# Patient Record
Sex: Male | Born: 1965 | Race: Black or African American | Hispanic: No | Smoking: Never smoker
Health system: Southern US, Community
[De-identification: ages and names within clinical notes are randomized; demographics above are authoritative.]

---

## 1998-06-10 ENCOUNTER — Ambulatory Visit (HOSPITAL_BASED_OUTPATIENT_CLINIC_OR_DEPARTMENT_OTHER): Admission: RE | Admit: 1998-06-10 | Discharge: 1998-06-10 | Payer: Self-pay | Admitting: Urology

## 1999-04-28 ENCOUNTER — Ambulatory Visit (HOSPITAL_BASED_OUTPATIENT_CLINIC_OR_DEPARTMENT_OTHER): Admission: RE | Admit: 1999-04-28 | Discharge: 1999-04-28 | Payer: Self-pay | Admitting: Urology

## 2006-11-22 ENCOUNTER — Ambulatory Visit: Payer: Self-pay | Admitting: Family Medicine

## 2008-09-10 ENCOUNTER — Ambulatory Visit: Payer: Self-pay | Admitting: Family Medicine

## 2008-12-15 ENCOUNTER — Ambulatory Visit: Payer: Self-pay | Admitting: Family Medicine

## 2010-06-09 NOTE — Op Note (Signed)
[place]  Patient:    Andre Stafford, Andre Stafford                    MRN: [id number] Proc. Date: 04/28/99 Adm. Date:  [id number] Attending:  Liborio Nixon                           Operative Report  PREOPERATIVE DIAGNOSIS:  Urethral stricture, recurrent.  POSTOPERATIVE DIAGNOSIS:  Urethral stricture, recurrent.  PROCEDURE:  Cystoscopy, visual internal urethrotomy.  SURGEON:  Bertram Millard. Dahlstedt, M.D.  ANESTHESIA:  General with LMA.  COMPLICATIONS:  None.  BRIEF HISTORY:  A 45 year old gentleman with recurrent urethral stricture.  He as had recent urinary tract infections, and has voiding symptoms.  He was found recently to have recurrent stricture, and is taken to the operating room at this time for internal urethrotomy.  Risks and complications have been discussed with the patient and are understood.  He desires to proceed.  DESCRIPTION OF PROCEDURE:  Preoperatively, the patient was administered gentamicin, taken to the operating room a general anesthetic using the LMA was performed. e was placed in the dorsal lithotomy position.  Genitalia and perineum were prepped and draped.  The visual urethrotome was passed under direct vision through the patients urethra up to the urethral stricture which was in the proximal pendulous urethra.  The urethra was carefully incised at the 6 oclock position throughout  its length.  There were several narrowed areas within a 2 cm span.  All of these were easily incised.  The bulbous urethra was then inspected and found to be normal.  The prostatic urethra was normal, bladder was normal.  At this point, the strictures were again visualized.  They were again incised slightly such that they admitted a 25-French scope quite easily.  At this point, the bladder was drained, the scope removed, and the procedure terminated.  The patient was awakened and taken to PACU in stable condition.  He will  follow up in approximately two weeks.  He was discharged on Vicodin one p.o. q.6h. p.r.n.  pain, #15 and Pyridium 200 mg one p.o. q.6h. p.r.n. burning. DD:  04/28/99 TD:  04/28/99 Job: [id number]

## 2020-05-09 ENCOUNTER — Encounter: Payer: Self-pay | Admitting: Family Medicine

## 2020-05-09 ENCOUNTER — Other Ambulatory Visit: Payer: Self-pay

## 2020-05-09 ENCOUNTER — Ambulatory Visit (INDEPENDENT_AMBULATORY_CARE_PROVIDER_SITE_OTHER): Payer: 59 | Admitting: Family Medicine

## 2020-05-09 VITALS — BP 122/86 | HR 63 | Temp 97.3°F | Ht 73.0 in | Wt 172.8 lb

## 2020-05-09 DIAGNOSIS — Z1159 Encounter for screening for other viral diseases: Secondary | ICD-10-CM

## 2020-05-09 DIAGNOSIS — Z0000 Encounter for general adult medical examination without abnormal findings: Secondary | ICD-10-CM

## 2020-05-09 DIAGNOSIS — Z8042 Family history of malignant neoplasm of prostate: Secondary | ICD-10-CM

## 2020-05-09 DIAGNOSIS — Z23 Encounter for immunization: Secondary | ICD-10-CM | POA: Diagnosis not present

## 2020-05-09 DIAGNOSIS — Z1211 Encounter for screening for malignant neoplasm of colon: Secondary | ICD-10-CM

## 2020-05-09 DIAGNOSIS — F640 Transsexualism: Secondary | ICD-10-CM

## 2020-05-09 NOTE — Progress Notes (Signed)
   Subjective:    Patient ID: Andre Stafford, male    DOB: [birth year], 55 y.o.   MRN: [id number]  HPI He is here after not being seen here in over a decade.  He would like a complete examination done.  He mentions that he has questions about his gender identity.  He states that he has had these thoughts his entire life.  He is now starting to address this issue.  He has made some changes to his facial hair by trying to eliminate that, changing his hair style, using fingernail polish.  He apparently has discussed this with his wife and daughter.  He would like to pursue this further.  Otherwise he has no particular concerns or complaints.  He does have a family history of prostate cancer.  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  Family and social history as well as health maintenance and immunizations was reviewed.   Review of Systems  All other systems reviewed and are negative.      Objective:   Physical Exam Alert and in no distress. tympanic membranes and canals are normal. Pharyngeal area is normal. Neck is supple without adenopathy or thyromegaly. Cardiac exam shows a regular sinus rhythm without murmurs or gallops. Lungs are clear to auscultation.  Abdominal exam shows no masses or tenderness.        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  He has done some research.  Gave him the [facility] website to go to.  Also gave him a business card for [employer] to get some counseling concerning this issue.  Explained that we can keep in touch  in all this but I was not planning on making him a return call but we will definitely see him on a yearly basis or sooner if need be.  He was comfortable with that.

## 2020-05-10 LAB — CBC WITH DIFFERENTIAL/PLATELET
Basophils Absolute: 0.1 10*3/uL (ref 0.0–0.2)
Basos: 1 %
EOS (ABSOLUTE): 0.3 10*3/uL (ref 0.0–0.4)
Eos: 6 %
Hematocrit: 41.6 % (ref 37.5–51.0)
Hemoglobin: 13.9 g/dL (ref 13.0–17.7)
Immature Grans (Abs): 0 10*3/uL (ref 0.0–0.1)
Immature Granulocytes: 0 %
Lymphocytes Absolute: 1.2 10*3/uL (ref 0.7–3.1)
Lymphs: 23 %
MCH: 29.6 pg (ref 26.6–33.0)
MCHC: 33.4 g/dL (ref 31.5–35.7)
MCV: 89 fL (ref 79–97)
Monocytes Absolute: 0.6 10*3/uL (ref 0.1–0.9)
Monocytes: 11 %
Neutrophils Absolute: 3.1 10*3/uL (ref 1.4–7.0)
Neutrophils: 59 %
Platelets: 207 10*3/uL (ref 150–450)
RBC: 4.69 x10E6/uL (ref 4.14–5.80)
RDW: 12.4 % (ref 11.6–15.4)
WBC: 5.2 10*3/uL (ref 3.4–10.8)

## 2020-05-10 LAB — LIPID PANEL
Chol/HDL Ratio: 2.9 ratio (ref 0.0–5.0)
Cholesterol, Total: 198 mg/dL (ref 100–199)
HDL: 68 mg/dL (ref >39)
LDL Chol Calc (NIH): 122 mg/dL — ABNORMAL HIGH (ref 0–99)
Triglycerides: 44 mg/dL (ref 0–149)
VLDL Cholesterol Cal: 8 mg/dL (ref 5–40)

## 2020-05-10 LAB — COMPREHENSIVE METABOLIC PANEL
ALT: 19 IU/L (ref 0–44)
AST: 22 IU/L (ref 0–40)
Albumin/Globulin Ratio: 1.9 (ref 1.2–2.2)
Albumin: 4.5 g/dL (ref 3.8–4.9)
Alkaline Phosphatase: 86 IU/L (ref 44–121)
BUN/Creatinine Ratio: 14 (ref 9–20)
BUN: 17 mg/dL (ref 6–24)
Bilirubin Total: 0.7 mg/dL (ref 0.0–1.2)
CO2: 22 mmol/L (ref 20–29)
Calcium: 9.8 mg/dL (ref 8.7–10.2)
Chloride: 103 mmol/L (ref 96–106)
Creatinine, Ser: 1.18 mg/dL (ref 0.76–1.27)
Globulin, Total: 2.4 g/dL (ref 1.5–4.5)
Glucose: 89 mg/dL (ref 65–99)
Potassium: 4.8 mmol/L (ref 3.5–5.2)
Sodium: 140 mmol/L (ref 134–144)
Total Protein: 6.9 g/dL (ref 6.0–8.5)
eGFR: 73 mL/min/{1.73_m2} (ref >59)

## 2020-05-10 LAB — SPECIMEN STATUS REPORT

## 2020-05-10 LAB — HEPATITIS C ANTIBODY: Hep C Virus Ab: <0.1 s/co ratio (ref 0.0–0.9)

## 2020-05-10 LAB — PSA: Prostate Specific Ag, Serum: 0.7 ng/mL (ref 0.0–4.0)

## 2020-05-16 LAB — COLOGUARD: Cologuard: NEGATIVE

## 2020-05-18 LAB — COLOGUARD

## 2020-05-21 LAB — COLOGUARD: COLOGUARD: NEGATIVE

## 2020-05-24 ENCOUNTER — Encounter: Payer: Self-pay | Admitting: Family Medicine

## 2020-05-24 ENCOUNTER — Other Ambulatory Visit: Payer: Self-pay

## 2020-05-24 ENCOUNTER — Ambulatory Visit (INDEPENDENT_AMBULATORY_CARE_PROVIDER_SITE_OTHER): Payer: 59 | Admitting: Family Medicine

## 2020-05-24 ENCOUNTER — Ambulatory Visit
Admission: RE | Admit: 2020-05-24 | Discharge: 2020-05-24 | Disposition: A | Discharge status: Home / Self Care | Payer: 59 | Source: Ambulatory Visit | Attending: Family Medicine | Admitting: Family Medicine

## 2020-05-24 VITALS — BP 128/86 | HR 88 | Temp 99.6°F | Ht 73.5 in | Wt 178.8 lb

## 2020-05-24 DIAGNOSIS — M79675 Pain in left toe(s): Secondary | ICD-10-CM | POA: Diagnosis not present

## 2020-05-24 NOTE — Progress Notes (Signed)
   Subjective:    Patient ID: Andre Stafford, male    DOB: 1965-09-30, 55 y.o.   MRN: [id number]  HPI The patient is here for evaluation and treatment of left great toe pain.  The original injury occurred in the 90s when she was doing martial arts.  Since then she has had intermittent pain in area with increased physical activity.  Usually the pain occurs after the physical activity is over.   Review of Systems     Objective:   Physical Exam Left foot evaluation shows normal motion with no palpable tenderness to the ankle, MTP or DIP joints.  Normal motion.       Assessment & Plan:  Great toe pain, left - Plan: DG Toe Great Left I explained that probably this is an arthritic change from a previous injury and then reinjuring it with increased physical activity.  Discussed patient's in regard to care.  Recommended shoes with good arch supports to help take some of the pressure off of that joint.  Mention the possibility of podiatry or orthopedics for more definitive care.  Recommend Tylenol and NSAID of choice for pain relief.

## 2020-05-24 NOTE — Progress Notes (Signed)
Pt was advised at appointment today 05/24/20. KH

## 2020-05-25 ENCOUNTER — Encounter: Payer: Self-pay | Admitting: Family Medicine

## 2020-07-19 ENCOUNTER — Other Ambulatory Visit: Payer: Self-pay

## 2020-07-19 ENCOUNTER — Ambulatory Visit (INDEPENDENT_AMBULATORY_CARE_PROVIDER_SITE_OTHER): Payer: 59 | Admitting: Family Medicine

## 2020-07-19 VITALS — BP 102/68 | HR 65 | Temp 97.9°F | Wt 169.4 lb

## 2020-07-19 DIAGNOSIS — N410 Acute prostatitis: Secondary | ICD-10-CM

## 2020-07-19 DIAGNOSIS — Z23 Encounter for immunization: Secondary | ICD-10-CM

## 2020-07-19 DIAGNOSIS — R350 Frequency of micturition: Secondary | ICD-10-CM

## 2020-07-19 LAB — POCT URINALYSIS DIP (PROADVANTAGE DEVICE)
Glucose, UA: NEGATIVE mg/dL
Ketones, POC UA: NEGATIVE mg/dL
Nitrite, UA: NEGATIVE
Specific Gravity, Urine: 1.025
Urobilinogen, Ur: 0.2
pH, UA: 6 (ref 5.0–8.0)

## 2020-07-19 MED ORDER — SULFAMETHOXAZOLE-TRIMETHOPRIM 800-160 MG PO TABS
1.0000 | ORAL_TABLET | Freq: Two times a day (BID) | ORAL | 0 refills | Status: DC
Start: 2020-07-19 — End: 2020-11-10

## 2020-07-19 NOTE — Progress Notes (Signed)
   Subjective:    Patient ID: Andre Stafford, male    DOB: 1965/12/29, 55 y.o.   MRN: [id number]  HPI He states that 4 days ago he had an the onset of chills, myalgias, urinary hesitancy, decreased stream and seeing pinkish urine.  No lower abdominal or rectal discomfort.  No sexual activity in the last several months.   Review of Systems     Objective:   Physical Exam Alert and in no distress.  Genital exam shows normal penis and testes.  No lower abdominal discomfort.  Rectal exam shows a normal-sized prostate that is nontender. Urine microscopic exam did show red cells and white cells.       Assessment & Plan:  Acute prostatitis  Frequency of urination - Plan: POCT Urinalysis DIP (Proadvantage Device)  Need for viral immunization - Plan: SARSCOV2 MODERNA BOOSTER VACCINE, 50 MCG/0.25 ML, IM Return here in about 3 weeks for recheck on the urine.  Sooner if there is any questions.

## 2020-07-19 NOTE — Addendum Note (Signed)
Addended by: Renelda Loma on: 07/19/2020 01:21 PM   Modules accepted: Orders

## 2020-07-27 LAB — URINE CULTURE

## 2020-08-09 ENCOUNTER — Encounter: Payer: Self-pay | Admitting: Family Medicine

## 2020-08-09 ENCOUNTER — Other Ambulatory Visit: Payer: Self-pay

## 2020-08-09 ENCOUNTER — Ambulatory Visit (INDEPENDENT_AMBULATORY_CARE_PROVIDER_SITE_OTHER): Payer: 59 | Admitting: Family Medicine

## 2020-08-09 VITALS — BP 112/70 | HR 52 | Temp 96.6°F | Wt 170.6 lb

## 2020-08-09 DIAGNOSIS — N410 Acute prostatitis: Secondary | ICD-10-CM | POA: Diagnosis not present

## 2020-08-09 LAB — POCT URINALYSIS DIP (PROADVANTAGE DEVICE)
Bilirubin, UA: NEGATIVE
Blood, UA: NEGATIVE
Glucose, UA: NEGATIVE mg/dL
Ketones, POC UA: NEGATIVE mg/dL
Leukocytes, UA: NEGATIVE
Nitrite, UA: NEGATIVE
Protein Ur, POC: NEGATIVE mg/dL
Specific Gravity, Urine: 1.03
Urobilinogen, Ur: 0.2
pH, UA: 6 (ref 5.0–8.0)

## 2020-08-09 NOTE — Addendum Note (Signed)
Addended by: Renelda Loma on: 08/09/2020 11:04 AM   Modules accepted: Orders

## 2020-08-09 NOTE — Progress Notes (Signed)
   Subjective:    Patient ID: Andre Stafford, male    DOB: [birth year], 55 y.o.   MRN: [id number]  HPI She is here for recheck.  She states that she is now totally back to normal and having no urinary type symptoms.  Previous culture was reviewed and the appropriate antibiotic was given.   Review of Systems     Objective:   Physical Exam Alert and in no distress.  Urine dipstick was negative.       Assessment & Plan:  Acute prostatitis He is now asymptomatic so no further intervention needed.

## 2020-11-10 ENCOUNTER — Encounter: Payer: Self-pay | Admitting: Family Medicine

## 2020-11-10 ENCOUNTER — Ambulatory Visit (INDEPENDENT_AMBULATORY_CARE_PROVIDER_SITE_OTHER): Payer: 59 | Admitting: Family Medicine

## 2020-11-10 ENCOUNTER — Other Ambulatory Visit: Payer: Self-pay

## 2020-11-10 VITALS — BP 120/78 | HR 72 | Temp 97.1°F | Ht 74.0 in | Wt 173.6 lb

## 2020-11-10 DIAGNOSIS — L282 Other prurigo: Secondary | ICD-10-CM

## 2020-11-10 NOTE — Patient Instructions (Signed)
  I recommend claritin or allegra, once daily, to help with any itching. You can use an over-the-counter 1% hydrocortisone cream to any areas of itching as well (more the toe area, and the hand). Keep your skin well moisturized and drink plenty of water.  I would use bacitracin rather neosporin, and this only needs to be applied to the scabbed area on the toe (and if the blister opens).  Return if change in rash, if there is any redness, swelling, pain, fever, or other concerns.

## 2020-11-10 NOTE — Progress Notes (Signed)
Chief Complaint  Patient presents with   Rash    Rash x 2 days both feet, arms and hands. Very itchy, no pain. Blister-like.     Started with a white spot on the left 4th toe (originally looked like a white spot, now looks black/scabbed).  He put neosporin and a bandage. It was itchy.  Also noted a spot on the L hand, which popped and is now healing. Yesterday he saw the blisters on the left foot, near the area that was a white spot.  All very itchy. He reports using a round bandage and neosporin, and some coconut oil, none of which was a new product.  Yesterday also noticed fine bumps and itching of the right forearm. He has been using Benzocaine for itching, not using other topical medications.  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  No known skin exposure.  Denies STD exposure/risk  PMH, PSH, SH reviewed  Outpatient Encounter Medications as of 11/10/2020  Medication Sig   APPLE CIDER VINEGAR PO Take by mouth.   Ascorbic Acid (VITAMIN C PO) Take by mouth.   ASHWAGANDHA PO Take by mouth.   Nutritional Supplements (EAA SUPPLEMENT PO) Take by mouth.   [DISCONTINUED] sulfamethoxazole-trimethoprim (BACTRIM DS) 800-160 MG tablet Take 1 tablet by mouth 2 (two) times daily. (Patient not taking: Reported on 08/09/2020)   No facility-administered encounter medications on file as of 11/10/2020.   No Known Allergies  ROS: no fever, chills, URI symptoms, headaches, GI complaints.  Denies any STD symptoms or exposures. Itchy skin and rash as per HPI.   PHYSICAL EXAM:  BP 120/78   Pulse 72   Temp (!) 97.1 F (36.2 C) (Tympanic)   Ht 6\' 2"  (1.88 m)   Wt 173 lb 9.6 oz (78.7 kg)   BMI 22.29 kg/m   Well appearing patient in no distress, in good spirits HEENT: conjunctiva and sclera are clear, EOMI, wearing mask Skin: L foot: Cluster of blisters at the top of the foot at the distal 4th metatarsal.  Doesn't extend onto toe, but that is where there is a small scab,  at the base of the toe. No erythema, soft tissue swelling.  Blisters are small, the larger one is a little tense, slightly tender. L hand--pt reports looked similar to the foot, but "popped"--slightly raised and hyperpigmented. R forearm--slightly dry, no rash noted, didn't feel different than the left arm (which wasn't itchy) Psych: normal mood, affect, hygiene and grooming   ASSESSMENT/PLAN:   Pruritic rash - unclear etiology; blisters on foot may be contact derm.  Antihistamine, moisturize, antibact ointment to scabs/open blisters.  f/u if worsens   I recommend claritin or allegra, once daily, to help with any itching. You can use an over-the-counter 1% hydrocortisone cream to any areas of itching as well (more the toe area, and the hand). Keep your skin well moisturized and drink plenty of water.  I would use bacitracin rather neosporin, and this only needs to be applied to the scabbed area on the toe (and if the blister opens).  Return if change in rash, if there is any redness, swelling, pain, fever, or other concerns.

## 2020-11-22 ENCOUNTER — Other Ambulatory Visit: Payer: Self-pay

## 2020-11-22 ENCOUNTER — Ambulatory Visit (INDEPENDENT_AMBULATORY_CARE_PROVIDER_SITE_OTHER): Payer: 59

## 2020-11-22 DIAGNOSIS — Z23 Encounter for immunization: Secondary | ICD-10-CM

## 2020-12-06 ENCOUNTER — Other Ambulatory Visit: Payer: Self-pay

## 2020-12-06 ENCOUNTER — Other Ambulatory Visit (INDEPENDENT_AMBULATORY_CARE_PROVIDER_SITE_OTHER): Payer: 59

## 2020-12-06 DIAGNOSIS — Z23 Encounter for immunization: Secondary | ICD-10-CM | POA: Diagnosis not present

## 2021-02-07 ENCOUNTER — Other Ambulatory Visit (INDEPENDENT_AMBULATORY_CARE_PROVIDER_SITE_OTHER): Payer: 59

## 2021-02-07 ENCOUNTER — Other Ambulatory Visit: Payer: Self-pay

## 2021-02-07 DIAGNOSIS — Z23 Encounter for immunization: Secondary | ICD-10-CM | POA: Diagnosis not present

## 2021-05-23 ENCOUNTER — Ambulatory Visit (INDEPENDENT_AMBULATORY_CARE_PROVIDER_SITE_OTHER): Payer: 59 | Admitting: Family Medicine

## 2021-05-23 ENCOUNTER — Encounter: Payer: Self-pay | Admitting: Family Medicine

## 2021-05-23 VITALS — BP 120/82 | HR 62 | Temp 96.8°F | Ht 73.0 in | Wt 184.0 lb

## 2021-05-23 DIAGNOSIS — Z0000 Encounter for general adult medical examination without abnormal findings: Secondary | ICD-10-CM

## 2021-05-23 DIAGNOSIS — Z8042 Family history of malignant neoplasm of prostate: Secondary | ICD-10-CM | POA: Diagnosis not present

## 2021-05-23 DIAGNOSIS — Z789 Other specified health status: Secondary | ICD-10-CM | POA: Diagnosis not present

## 2021-05-23 DIAGNOSIS — Z125 Encounter for screening for malignant neoplasm of prostate: Secondary | ICD-10-CM | POA: Diagnosis not present

## 2021-05-23 NOTE — Progress Notes (Signed)
? ?  Subjective:  ? ? Patient ID: Andre Stafford, adult    DOB: 1965/03/02, 56 y.o.   MRN: [id number] ? ?HPI ?The patient is here for complete exam.  She is in the process of transitioning from M to F.  She is involved in counseling and has been to 2 sessions.  She is doing this through the GGF.  Been living and dressing in the gender that she desires for the last 3 years.  She is in the process of getting a divorce and is now presently separated.  She does not smoke or drink.  She would eventually like to start on hormone replacement.  She does have a family history of prostate cancer. ? ? ?Review of Systems  ?All other systems reviewed and are negative. ? ?   ?Objective:  ? Physical Exam ?Alert and in no distress. Tympanic membranes and canals are normal. Pharyngeal area is normal. Neck is supple without adenopathy or thyromegaly. Cardiac exam shows a regular sinus rhythm without murmurs or gallops. Lungs are clear to auscultation. ? ? ? ? ?   ?Assessment & Plan:  ?Routine general medical examination at a health care facility - Plan: CBC with Differential/Platelet, Comprehensive metabolic panel, Lipid panel ? ?Family history of prostate cancer in father - Plan: PSA ? ?Male-to-male transgender person ? ?Screening for prostate cancer - Plan: PSA ?Discussed the continuing transition.  She will continue in counseling and I will ask her to have the therapist send me a note concerning this issue and the possibility of any underlying other psychological problems.  I anticipate no problems in she will eventually be placed on estrogen as well as spironolactone probably within the next year. ? ?

## 2021-05-24 LAB — COMPREHENSIVE METABOLIC PANEL
ALT: 20 IU/L (ref 0–44)
AST: 16 IU/L (ref 0–40)
Albumin/Globulin Ratio: 1.8 (ref 1.2–2.2)
Albumin: 4.6 g/dL (ref 3.8–4.9)
Alkaline Phosphatase: 89 IU/L (ref 44–121)
BUN/Creatinine Ratio: 11 (ref 9–20)
BUN: 13 mg/dL (ref 6–24)
Bilirubin Total: 0.6 mg/dL (ref 0.0–1.2)
CO2: 25 mmol/L (ref 20–29)
Calcium: 9.8 mg/dL (ref 8.7–10.2)
Chloride: 105 mmol/L (ref 96–106)
Creatinine, Ser: 1.15 mg/dL (ref 0.76–1.27)
Globulin, Total: 2.5 g/dL (ref 1.5–4.5)
Glucose: 94 mg/dL (ref 70–99)
Potassium: 4.8 mmol/L (ref 3.5–5.2)
Sodium: 142 mmol/L (ref 134–144)
Total Protein: 7.1 g/dL (ref 6.0–8.5)
eGFR: 75 mL/min/{1.73_m2} (ref >59)

## 2021-05-24 LAB — CBC WITH DIFFERENTIAL/PLATELET
Basophils Absolute: 0 10*3/uL (ref 0.0–0.2)
Basos: 1 %
EOS (ABSOLUTE): 0.2 10*3/uL (ref 0.0–0.4)
Eos: 5 %
Hematocrit: 42.3 % (ref 37.5–51.0)
Hemoglobin: 14.4 g/dL (ref 13.0–17.7)
Immature Grans (Abs): 0 10*3/uL (ref 0.0–0.1)
Immature Granulocytes: 0 %
Lymphocytes Absolute: 1.3 10*3/uL (ref 0.7–3.1)
Lymphs: 30 %
MCH: 29.7 pg (ref 26.6–33.0)
MCHC: 34 g/dL (ref 31.5–35.7)
MCV: 87 fL (ref 79–97)
Monocytes Absolute: 0.4 10*3/uL (ref 0.1–0.9)
Monocytes: 11 %
Neutrophils Absolute: 2.2 10*3/uL (ref 1.4–7.0)
Neutrophils: 53 %
Platelets: 194 10*3/uL (ref 150–450)
RBC: 4.85 x10E6/uL (ref 4.14–5.80)
RDW: 12.5 % (ref 11.6–15.4)
WBC: 4.1 10*3/uL (ref 3.4–10.8)

## 2021-05-24 LAB — PSA: Prostate Specific Ag, Serum: 1.6 ng/mL (ref 0.0–4.0)

## 2021-05-24 LAB — LIPID PANEL
Chol/HDL Ratio: 3.3 ratio (ref 0.0–5.0)
Cholesterol, Total: 217 mg/dL — ABNORMAL HIGH (ref 100–199)
HDL: 66 mg/dL (ref >39)
LDL Chol Calc (NIH): 143 mg/dL — ABNORMAL HIGH (ref 0–99)
Triglycerides: 44 mg/dL (ref 0–149)
VLDL Cholesterol Cal: 8 mg/dL (ref 5–40)

## 2021-07-14 ENCOUNTER — Encounter: Payer: Self-pay | Admitting: Medical

## 2021-07-14 ENCOUNTER — Ambulatory Visit (INDEPENDENT_AMBULATORY_CARE_PROVIDER_SITE_OTHER): Payer: 59 | Admitting: Medical

## 2021-07-14 VITALS — BP 120/70 | Temp 97.5°F | Wt 192.0 lb

## 2021-07-14 DIAGNOSIS — Z789 Other specified health status: Secondary | ICD-10-CM | POA: Diagnosis not present

## 2021-07-14 DIAGNOSIS — R8290 Unspecified abnormal findings in urine: Secondary | ICD-10-CM

## 2021-07-14 DIAGNOSIS — R350 Frequency of micturition: Secondary | ICD-10-CM

## 2021-07-14 LAB — POCT URINALYSIS DIP (PROADVANTAGE DEVICE)
Bilirubin, UA: NEGATIVE
Blood, UA: NEGATIVE
Glucose, UA: NEGATIVE mg/dL
Ketones, POC UA: NEGATIVE mg/dL
Leukocytes, UA: NEGATIVE
Nitrite, UA: POSITIVE — AB
Protein Ur, POC: NEGATIVE mg/dL
Specific Gravity, Urine: 1.025
Urobilinogen, Ur: 0.2
pH, UA: 6 (ref 5.0–8.0)

## 2021-07-14 MED ORDER — CIPROFLOXACIN HCL 500 MG PO TABS: 500.0000 mg | ORAL_TABLET | Freq: Two times a day (BID) | ORAL | 0 refills | Status: AC

## 2021-07-14 NOTE — Progress Notes (Signed)
Subjective:  Andre Stafford is a 56 y.o. adult who presents for Chief Complaint  Patient presents with   Acute Visit    Pt noticed a bad smell when she pees. No burning or frequency.     Here for possible UTI.  Been having urine odor for a few weeks.   No specific urgency or frequency.  No blood in the urine.  No penile discharge.  I do note having urinary tract infection about a year ago.  No current sexual activity in about a year.  Not sure about prior prostate infection.  No other symptoms currently.  No fever body aches or chills.  No back or abdominal pain. No other aggravating or relieving factors.    No other c/o.  No past medical history on file.  Current Outpatient Medications on File Prior to Visit  Medication Sig Dispense Refill   APPLE CIDER VINEGAR PO Take by mouth.     Ascorbic Acid (VITAMIN C PO) Take by mouth.     ASHWAGANDHA PO Take by mouth.     Nutritional Supplements (EAA SUPPLEMENT PO) Take by mouth.     No current facility-administered medications on file prior to visit.   The following portions of the patient's history were reviewed and updated as appropriate: allergies, current medications, past family history, past medical history, past social history, past surgical history and problem list.  ROS Otherwise as in subjective above    Objective: BP 120/70   Temp (!) 97.5 F (36.4 C)   Wt 192 lb (87.1 kg)   BMI 25.33 kg/m   General appearance: alert, no distress, well developed, well nourished    Assessment: Encounter Diagnoses  Name Primary?   Abnormal urine odor Yes   Frequency of urination    Transgender      Plan: I reviewed back her urine culture from June 2022.  Given the urine findings today begin antibiotic below.  Await culture.  Hydrate well.  Discussed risk and benefits of medication.  We discussed the difference between the timeframe for treating UTI versus prostate.  For now there is a suggestion of urinary tract infection but  cannot rule out prostate infection.  [name] was seen today for acute visit.  Diagnoses and all orders for this visit:  Abnormal urine odor -     Urine Culture  Frequency of urination -     POCT Urinalysis DIP (Proadvantage Device) -     Urine Culture  Transgender  Other orders -     ciprofloxacin (CIPRO) 500 MG tablet; Take 1 tablet (500 mg total) by mouth 2 (two) times daily for 3 days.    Follow up: pending culture

## 2021-07-20 LAB — URINE CULTURE

## 2021-09-27 ENCOUNTER — Encounter: Payer: Self-pay | Admitting: Internal Medicine

## 2021-11-13 ENCOUNTER — Encounter: Payer: Self-pay | Admitting: Internal Medicine

## 2022-04-18 DIAGNOSIS — F649 Gender identity disorder, unspecified: Secondary | ICD-10-CM | POA: Diagnosis not present

## 2022-04-24 LAB — BASIC METABOLIC PANEL: EGFR: 76

## 2022-04-26 DIAGNOSIS — F649 Gender identity disorder, unspecified: Secondary | ICD-10-CM | POA: Diagnosis not present

## 2022-05-30 ENCOUNTER — Encounter: Payer: Self-pay | Admitting: Family Medicine

## 2022-05-30 ENCOUNTER — Ambulatory Visit (INDEPENDENT_AMBULATORY_CARE_PROVIDER_SITE_OTHER): Payer: 59 | Admitting: Family Medicine

## 2022-05-30 VITALS — BP 110/72 | HR 55 | Temp 97.8°F | Resp 14 | Ht 74.0 in | Wt 186.0 lb

## 2022-05-30 DIAGNOSIS — Z23 Encounter for immunization: Secondary | ICD-10-CM | POA: Diagnosis not present

## 2022-05-30 DIAGNOSIS — Z8042 Family history of malignant neoplasm of prostate: Secondary | ICD-10-CM | POA: Diagnosis not present

## 2022-05-30 DIAGNOSIS — Z789 Other specified health status: Secondary | ICD-10-CM

## 2022-05-30 DIAGNOSIS — Z0000 Encounter for general adult medical examination without abnormal findings: Secondary | ICD-10-CM | POA: Diagnosis not present

## 2022-05-30 LAB — POCT URINALYSIS DIP (PROADVANTAGE DEVICE)
Bilirubin, UA: NEGATIVE
Blood, UA: NEGATIVE
Glucose, UA: NEGATIVE mg/dL
Leukocytes, UA: NEGATIVE
Nitrite, UA: NEGATIVE
Protein Ur, POC: NEGATIVE mg/dL
Specific Gravity, Urine: 1.02
Urobilinogen, Ur: 0.2
pH, UA: 6 (ref 5.0–8.0)

## 2022-05-30 NOTE — Progress Notes (Signed)
Complete physical exam  Patient: Andre Stafford   DOB: 19-Jan-1966   57 y.o. Adult  MRN: [id number]  Subjective:    Chief Complaint  Patient presents with   Annual Exam    Fasting. No additional concerns.     Andre Stafford is a 57 y.o. adult who presents today for a complete physical exam. She reports consuming a general diet. The patient does not participate in regular exercise at present. She generally feels fairly well. She reports sleeping fairly well. She started on hormone replacement approximately 6 weeks ago and presently is on estrogen 0.2 weekly as well as spironolactone 50 mg a day.  She was on higher dosing of that but had difficulty with potassium levels.  She has been through counseling and has a good support system.  She is separated and apparently getting ready to get a divorce.  She was on no other medications.  Does not smoke or drink.  Does have a family history of prostate cancer.  Otherwise her family and social history as well as health maintenance and immunizations was reviewed.  Most recent fall risk assessment:    05/30/2022    8:45 AM  Fall Risk   Falls in the past year? 1  Number falls in past yr: 0  Injury with Fall? 0  Risk for fall due to : No Fall Risks  Follow up Falls evaluation completed     Most recent depression screenings:    05/30/2022    8:45 AM 05/23/2021   10:12 AM  PHQ 2/9 Scores  PHQ - 2 Score 0 0    Vision:Not within last year  and Dental: No regular dental care     Patient Care Team: Ronnald Nian, MD as PCP - General (Family Medicine)   Outpatient Medications Prior to Visit  Medication Sig   APPLE CIDER VINEGAR PO Take by mouth.   Ascorbic Acid (VITAMIN C PO) Take by mouth.   ASHWAGANDHA PO Take by mouth.   [DISCONTINUED] Nutritional Supplements (EAA SUPPLEMENT PO) Take by mouth. (Patient not taking: Reported on 05/30/2022)   No facility-administered medications prior to visit.    Review of Systems  All other systems  reviewed and are negative.         Objective:     BP 110/72   Pulse (!) 55   Temp 97.8 F (36.6 C) (Oral)   Resp 14   Ht 6\' 2"  (1.88 m)   Wt 186 lb (84.4 kg)   SpO2 99% Comment: room air  BMI 23.88 kg/m    Physical Exam  Alert and in no distress. Tympanic membranes and canals are normal. Pharyngeal area is normal. Neck is supple without adenopathy or thyromegaly. Cardiac exam shows a regular sinus rhythm without murmurs or gallops. Lungs are clear to auscultation.      Assessment & Plan:    Routine general medical examination at a health care facility - Plan: POCT Urinalysis DIP (Proadvantage Device)  Family history of prostate cancer in father  Transgender  Need for COVID-19 vaccine - Plan: Pfizer Fall 2023 Covid-19 Vaccine 87yrs and older  Immunization History  Administered Date(s) Administered   Influenza,inj,Quad PF,6+ Mos 11/22/2020   Moderna Covid-19 Vaccine Bivalent Booster 39yrs & up 11/22/2020   Moderna SARS-COV2 Booster Vaccination 07/19/2020   Moderna Sars-Covid-2 Vaccination 03/27/2019, 04/23/2019, 12/31/2019   Tdap 05/09/2020   Zoster Recombinat (Shingrix) 12/06/2020, 02/07/2021    Health Maintenance  Topic Date Due   HIV Screening  Never done   COVID-19 Vaccine (6 - 2023-24 season) 09/22/2021   INFLUENZA VACCINE  08/23/2022   Fecal DNA (Cologuard)  05/17/2023   DTaP/Tdap/Td (2 - Td or Tdap) 05/10/2030   Hepatitis C Screening  Completed   Zoster Vaccines- Shingrix  Completed   HPV VACCINES  Aged Out    I explained that I would be happy to take over the care of of the estrogen and spironolactone.  Recommend setting up an appointment at about 6 weeks at which point it will be roughly 3 months down the road and we can check testosterone estrogen etc. etc. and also check TSH. Problem List Items Addressed This Visit       Other   Family history of prostate cancer in father   Other Visit Diagnoses     Routine general medical examination at a  health care facility    -  Primary   Transgender          No follow-ups on file.     Benjiman Core, CMA

## 2022-07-11 ENCOUNTER — Ambulatory Visit: Payer: 59 | Admitting: Family Medicine

## 2022-07-11 ENCOUNTER — Encounter: Payer: Self-pay | Admitting: Family Medicine

## 2022-07-11 VITALS — BP 132/80 | HR 56 | Temp 97.7°F | Resp 16 | Wt 196.0 lb

## 2022-07-11 DIAGNOSIS — Z79899 Other long term (current) drug therapy: Secondary | ICD-10-CM | POA: Diagnosis not present

## 2022-07-11 DIAGNOSIS — Z789 Other specified health status: Secondary | ICD-10-CM | POA: Diagnosis not present

## 2022-07-11 NOTE — Progress Notes (Addendum)
   Subjective:    Patient ID: Andre Stafford, adult    DOB: 09/13/1965, 57 y.o.   MRN: [id number]  HPI He is here for consult concerning HRT.  He was originally placed on these medications through [facility].  He had been involved in counseling before he got involved in HRT.  He seems to be comfortable with his present lifestyle.  He has been on estrogen and low-dose spironolactone since March 27.  They placed him on low-dose spironolactone because potassium was slightly elevated.  He is also taking .2 mL weekly of estradiol.  He thinks he has noted slight breast development.   Review of Systems     Objective:   Physical Exam Alert and in no distress otherwise not examined       Assessment & Plan:  Transgender - Plan: Estrogens, Total, Comprehensive metabolic panel, Testosterone  Encounter for long-term (current) use of high-risk medication - Plan: Estrogens, Total, Comprehensive metabolic panel, Testosterone

## 2022-07-12 LAB — COMPREHENSIVE METABOLIC PANEL
Alkaline Phosphatase: 86 IU/L (ref 44–121)
BUN/Creatinine Ratio: 13 (ref 9–20)
BUN: 13 mg/dL (ref 6–24)
CO2: 23 mmol/L (ref 20–29)
Total Protein: 6.4 g/dL (ref 6.0–8.5)

## 2022-07-13 LAB — COMPREHENSIVE METABOLIC PANEL
Albumin: 4.1 g/dL (ref 3.8–4.9)
Glucose: 88 mg/dL (ref 70–99)
eGFR: 88 mL/min/{1.73_m2} (ref >59)

## 2022-07-13 LAB — ESTROGENS, TOTAL

## 2022-07-13 LAB — TESTOSTERONE: Testosterone: 10 ng/dL — ABNORMAL LOW (ref 264–916)

## 2022-07-14 LAB — COMPREHENSIVE METABOLIC PANEL
Bilirubin Total: 0.4 mg/dL (ref 0.0–1.2)
Chloride: 103 mmol/L (ref 96–106)
Globulin, Total: 2.3 g/dL (ref 1.5–4.5)
Potassium: 4.8 mmol/L (ref 3.5–5.2)
Sodium: 136 mmol/L (ref 134–144)

## 2022-07-15 LAB — COMPREHENSIVE METABOLIC PANEL
ALT: 13 IU/L (ref 0–44)
AST: 17 IU/L (ref 0–40)
Calcium: 9.5 mg/dL (ref 8.7–10.2)
Creatinine, Ser: 1 mg/dL (ref 0.76–1.27)

## 2022-07-17 ENCOUNTER — Encounter: Payer: Self-pay | Admitting: Family Medicine

## 2022-07-17 DIAGNOSIS — Z79899 Other long term (current) drug therapy: Secondary | ICD-10-CM

## 2022-07-17 DIAGNOSIS — Z789 Other specified health status: Secondary | ICD-10-CM

## 2022-07-19 NOTE — Telephone Encounter (Signed)
I have patient's 1 month lab recheck appointment scheduled. Which labs did you want to recheck? Estrogen and Testosterone?

## 2022-07-27 ENCOUNTER — Other Ambulatory Visit: Payer: Self-pay | Admitting: Family Medicine

## 2022-07-27 NOTE — Telephone Encounter (Signed)
Pt left voice mail that he needs refill on Estradiol

## 2022-07-27 NOTE — Telephone Encounter (Signed)
Ok to refill 

## 2022-07-28 MED ORDER — ESTRADIOL VALERATE 40 MG/ML IM OIL: 8.0000 mg | TOPICAL_OIL | INTRAMUSCULAR | 1 refills | Status: DC

## 2022-08-20 ENCOUNTER — Other Ambulatory Visit: Payer: 59

## 2022-08-20 DIAGNOSIS — Z79899 Other long term (current) drug therapy: Secondary | ICD-10-CM | POA: Diagnosis not present

## 2022-08-20 DIAGNOSIS — Z789 Other specified health status: Secondary | ICD-10-CM | POA: Diagnosis not present

## 2022-10-02 ENCOUNTER — Encounter: Payer: Self-pay | Admitting: Family Medicine

## 2022-10-03 MED ORDER — SPIRONOLACTONE 50 MG PO TABS: 50.0000 mg | ORAL_TABLET | Freq: Two times a day (BID) | ORAL | 1 refills | Status: DC

## 2022-10-15 ENCOUNTER — Ambulatory Visit: Payer: 59 | Admitting: Medical

## 2022-10-17 ENCOUNTER — Ambulatory Visit: Payer: 59 | Admitting: Medical

## 2022-10-17 VITALS — BP 112/70 | HR 64 | Temp 97.2°F | Wt 190.4 lb

## 2022-10-17 DIAGNOSIS — H1031 Unspecified acute conjunctivitis, right eye: Secondary | ICD-10-CM

## 2022-10-17 DIAGNOSIS — R300 Dysuria: Secondary | ICD-10-CM | POA: Diagnosis not present

## 2022-10-17 DIAGNOSIS — R8290 Unspecified abnormal findings in urine: Secondary | ICD-10-CM

## 2022-10-17 LAB — POCT URINALYSIS DIP (PROADVANTAGE DEVICE)
Bilirubin, UA: NEGATIVE
Blood, UA: NEGATIVE
Glucose, UA: NEGATIVE mg/dL
Ketones, POC UA: NEGATIVE mg/dL
Nitrite, UA: POSITIVE — AB
Protein Ur, POC: NEGATIVE mg/dL
Specific Gravity, Urine: 1.02
Urobilinogen, Ur: NEGATIVE
pH, UA: 6 (ref 5.0–8.0)

## 2022-10-17 MED ORDER — SULFAMETHOXAZOLE-TRIMETHOPRIM 800-160 MG PO TABS
1.0000 | ORAL_TABLET | Freq: Two times a day (BID) | ORAL | 0 refills | Status: DC
Start: 2022-10-17 — End: 2023-01-10

## 2022-10-17 MED ORDER — ERYTHROMYCIN 5 MG/GM OP OINT: 1.0000 | TOPICAL_OINTMENT | Freq: Every day | OPHTHALMIC | 0 refills | Status: DC

## 2022-10-17 NOTE — Progress Notes (Signed)
Subjective:  BASHAN CUTRONE is a 57 y.o. adult who presents for Chief Complaint  Patient presents with   possible bladder issues    Possible bladder issues- odor, white discharge, and skin abrasion  Right eye inflammation- working outside on Monday and Tuesday woke up with red eye     Has urinary concern.  Has urine odor.  Has white discharge for several days.  Has some urgency.    Currently on spironolactone, still getting use to this including other body odors, urinary frequency  Last sexual activity 2 years ago, no specific concern for STD.  Has red eye on right.  Was doing some pressure washing on his porch, spraying off some mold.  Denies injury to eye, but awoke next day with redness, crusting and irritation.  Not watery, no vision changes.     No other aggravating or relieving factors.    No other c/o.  No past medical history on file. Current Outpatient Medications on File Prior to Visit  Medication Sig Dispense Refill   APPLE CIDER VINEGAR PO Take by mouth.     Ascorbic Acid (VITAMIN C PO) Take by mouth.     ASHWAGANDHA PO Take by mouth.     estradiol valerate (DELESTROGEN) 40 MG/ML injection Inject 0.2 mLs (8 mg total) into the muscle once a week. 5 mL 1   spironolactone (ALDACTONE) 50 MG tablet Take 1 tablet (50 mg total) by mouth 2 (two) times daily. Currently taking one tablet daily 180 tablet 1   No current facility-administered medications on file prior to visit.     The following portions of the patient's history were reviewed and updated as appropriate: allergies, current medications, past family history, past medical history, past social history, past surgical history and problem list.  ROS Otherwise as in subjective above  Objective: BP 112/70   Pulse 64   Temp (!) 97.2 F (36.2 C)   Wt 190 lb 6.4 oz (86.4 kg)   BMI 24.45 kg/m   General appearance: alert, no distress, well developed, well nourished HEENT: normocephalic, sclerae anicteric,  conjunctiva pink and moist, TMs pearly, nares patent, no discharge or erythema, pharynx normal Eyes: right eye with conjunctival injection, erythema but no crusting, PERRLA, EOMi    Assessment: Encounter Diagnoses  Name Primary?   Abnormal urine odor Yes   Acute conjunctivitis of right eye, unspecified acute conjunctivitis type    Dysuria      Plan: Abnormal urine odor, dysuria -begin Bactrim for possible UTI.  Other labs as below.  Of note had a positive urinary tract infection June 2023 which cleared up with Cipro  Conjunctivitis - begin Romycin ointment, warm compresses, good hygiene, and if not much improved within 3 to 4 days let me know  Rajahn "Yves Dill" was seen today for possible bladder issues.  Diagnoses and all orders for this visit:  Abnormal urine odor -     POCT Urinalysis DIP (Proadvantage Device) -     Chlamydia/Gonococcus/Trichomonas, NAA -     Urine Culture  Acute conjunctivitis of right eye, unspecified acute conjunctivitis type  Dysuria  Other orders -     erythromycin ophthalmic ointment; Place 1 Application into the right eye at bedtime. -     sulfamethoxazole-trimethoprim (BACTRIM DS) 800-160 MG tablet; Take 1 tablet by mouth 2 (two) times daily.    Follow up: pending labs

## 2022-10-19 NOTE — Progress Notes (Signed)
Results sent through MyChart

## 2022-10-20 LAB — URINE CULTURE

## 2022-10-21 LAB — CHLAMYDIA/GONOCOCCUS/TRICHOMONAS, NAA
Chlamydia by NAA: NEGATIVE
Gonococcus by NAA: NEGATIVE
Trich vag by NAA: NEGATIVE

## 2022-10-21 NOTE — Progress Notes (Signed)
Results sent through MyChart

## 2022-11-06 IMAGING — CR DG TOE GREAT 2+V*L*
3 series · 3 of 3 positions shown · non-contrast
Comparison: None.

CLINICAL DATA: Left great toe pain with physical activity. Pain
about the medial aspect.

EXAM:
LEFT GREAT TOE

[x toes ap left]
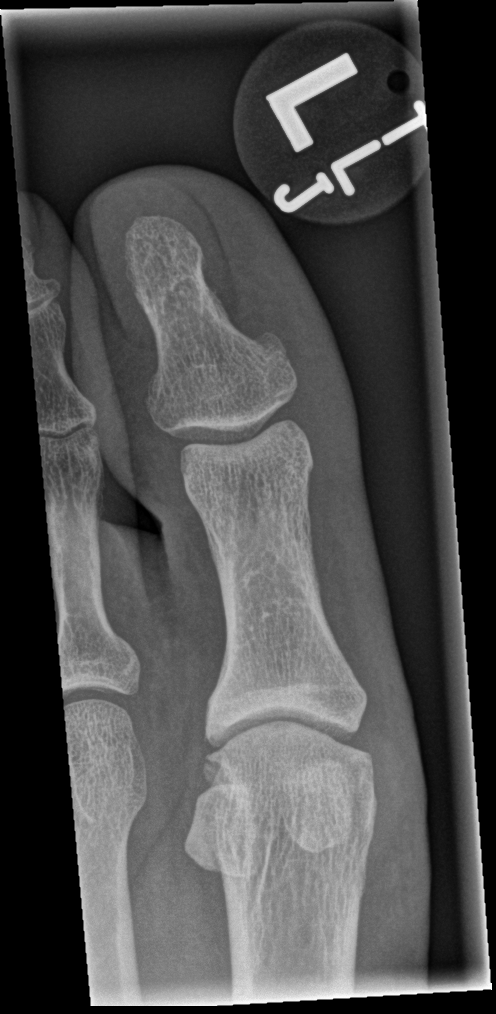

[x toes obl left]
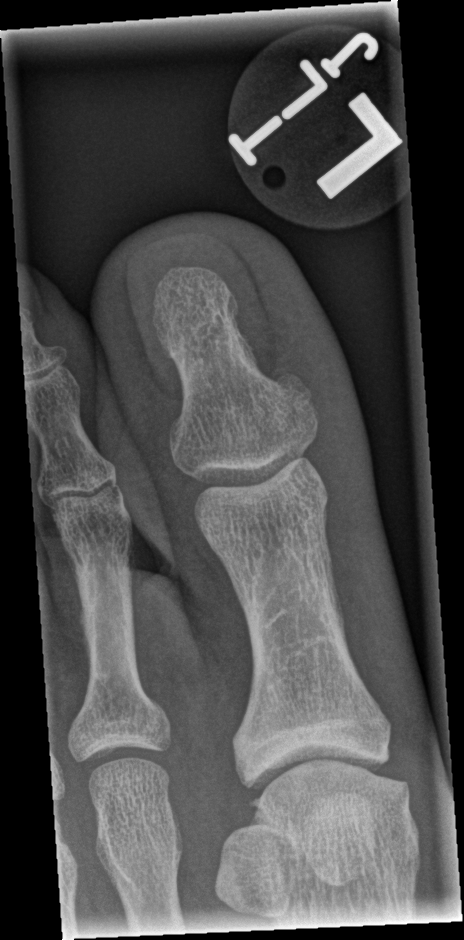

[x toes lat left]
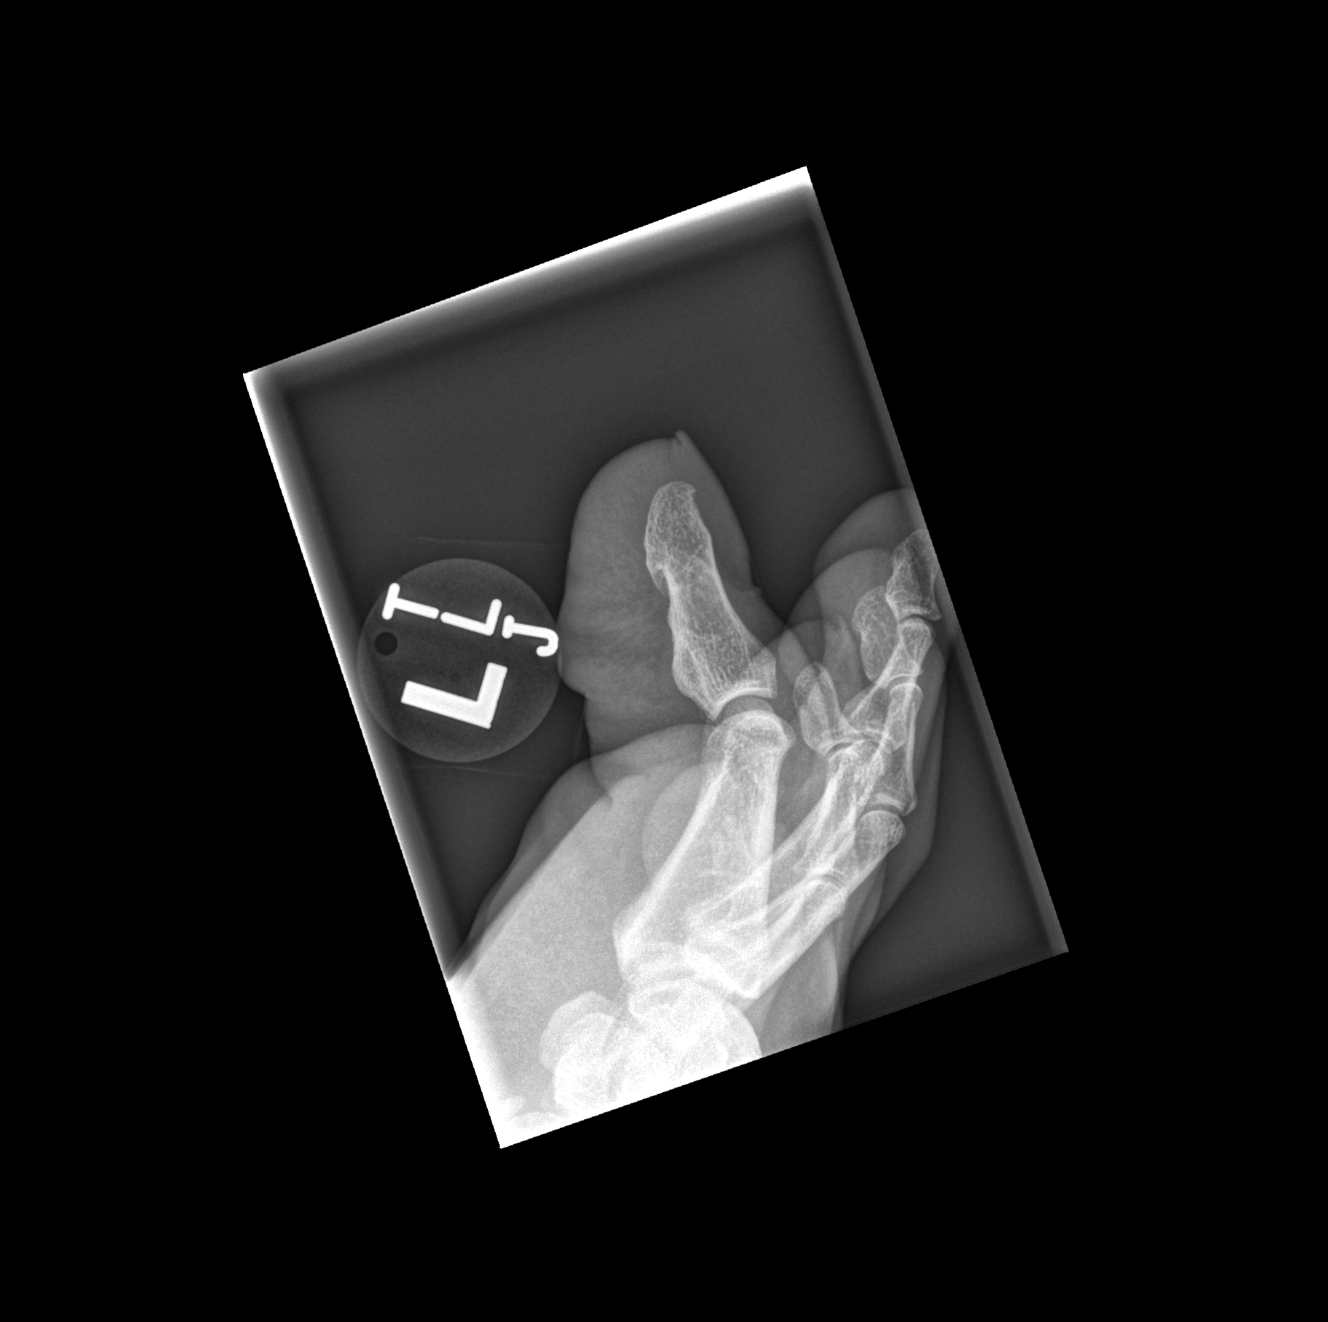

[3 of 3 positions shown; findings below may reference images not displayed]

FINDINGS: There is no evidence of fracture or dislocation. Minimal
osteoarthritis of the metatarsal phalangeal joint with spurring. No
significant joint space narrowing. Interphalangeal joint is normal.
No erosion, periosteal reaction, or bone destruction. Soft tissues
are unremarkable.
IMPRESSION: Minimal osteoarthritis of the first metatarsophalangeal joint.
Otherwise unremarkable radiographic appearance of the great toe.

## 2022-11-09 ENCOUNTER — Telehealth: Payer: Self-pay | Admitting: Family Medicine

## 2022-11-09 ENCOUNTER — Other Ambulatory Visit: Payer: Self-pay

## 2022-11-09 MED ORDER — EASY TOUCH SYRINGE BARREL 1ML MISC: 0 refills | Status: AC

## 2022-11-09 MED ORDER — "BD DISP NEEDLE 23G X 1"" MISC": 0 refills | Status: AC

## 2022-11-09 MED ORDER — "BD DISP NEEDLES 18G X 1-1/2"" MISC": 0 refills | Status: AC

## 2022-11-09 NOTE — Telephone Encounter (Signed)
Pt called requesting refill on Needles and syringes  18 ga  1 1/2 23 ga 1 1/2  1 ml syringes

## 2022-11-09 NOTE — Telephone Encounter (Signed)
I have refilled this

## 2023-01-10 ENCOUNTER — Ambulatory Visit (INDEPENDENT_AMBULATORY_CARE_PROVIDER_SITE_OTHER): Payer: 59 | Admitting: Family Medicine

## 2023-01-10 VITALS — BP 120/72 | HR 68 | Wt 190.2 lb

## 2023-01-10 DIAGNOSIS — Z79899 Other long term (current) drug therapy: Secondary | ICD-10-CM | POA: Diagnosis not present

## 2023-01-10 DIAGNOSIS — Z789 Other specified health status: Secondary | ICD-10-CM | POA: Diagnosis not present

## 2023-01-10 DIAGNOSIS — Z23 Encounter for immunization: Secondary | ICD-10-CM | POA: Diagnosis not present

## 2023-01-10 NOTE — Progress Notes (Signed)
   Subjective:    Patient ID: Andre Stafford, adult    DOB: March 17, 1965, 57 y.o.   MRN: [id number]  HPI The patient is here for a follow-up visit concerning transgendered care.  She continues on 0.2 mg/week and did take the last shot last Friday.  She is also taking spironolactone and has noticed some breast development.  She is considering having further gender reassignment surgery at some point in the future.  She   Review of Systems     Objective:    Physical Exam Alert and in no distress. Tympanic membranes and canals are normal. Pharyngeal area is normal. Neck is supple without adenopathy or thyromegaly. Cardiac exam shows a regular sinus rhythm without murmurs or gallops. Lungs are clear to auscultation.        Assessment & Plan:  Transgender - Plan: CBC with Differential/Platelet, Comprehensive metabolic panel, Lipid panel, Testosterone, Estrogens, Total  COVID-19 vaccine administered - Plan: Pfizer Comirnaty Covid -19 Vaccine 62yrs and older  Needs flu shot - Plan: Flu vaccine trivalent PF, 6mos and older(Flulaval,Afluria,Fluarix,Fluzone)  Encounter for long-term (current) use of high-risk medication - Plan: CBC with Differential/Platelet, Comprehensive metabolic panel, Lipid panel, Testosterone, Estrogens, Total

## 2023-01-12 LAB — COMPREHENSIVE METABOLIC PANEL
ALT: 13 [IU]/L (ref 0–44)
AST: 14 [IU]/L (ref 0–40)
Albumin: 4.1 g/dL (ref 3.8–4.9)
Alkaline Phosphatase: 76 [IU]/L (ref 44–121)
BUN/Creatinine Ratio: 13 (ref 9–20)
BUN: 14 mg/dL (ref 6–24)
Bilirubin Total: 0.6 mg/dL (ref 0.0–1.2)
CO2: 19 mmol/L — ABNORMAL LOW (ref 20–29)
Calcium: 10 mg/dL (ref 8.7–10.2)
Chloride: 102 mmol/L (ref 96–106)
Creatinine, Ser: 1.04 mg/dL (ref 0.76–1.27)
Globulin, Total: 2.5 g/dL (ref 1.5–4.5)
Glucose: 92 mg/dL (ref 70–99)
Potassium: 4.7 mmol/L (ref 3.5–5.2)
Sodium: 137 mmol/L (ref 134–144)
Total Protein: 6.6 g/dL (ref 6.0–8.5)
eGFR: 84 mL/min/{1.73_m2} (ref >59)

## 2023-01-12 LAB — CBC WITH DIFFERENTIAL/PLATELET
Basophils Absolute: 0.1 10*3/uL (ref 0.0–0.2)
Basos: 1 %
EOS (ABSOLUTE): 0.2 10*3/uL (ref 0.0–0.4)
Eos: 3 %
Hematocrit: 39.9 % (ref 37.5–51.0)
Hemoglobin: 13.1 g/dL (ref 13.0–17.7)
Immature Grans (Abs): 0 10*3/uL (ref 0.0–0.1)
Immature Granulocytes: 0 %
Lymphocytes Absolute: 1.2 10*3/uL (ref 0.7–3.1)
Lymphs: 20 %
MCH: 30.8 pg (ref 26.6–33.0)
MCHC: 32.8 g/dL (ref 31.5–35.7)
MCV: 94 fL (ref 79–97)
Monocytes Absolute: 0.6 10*3/uL (ref 0.1–0.9)
Monocytes: 11 %
Neutrophils Absolute: 3.9 10*3/uL (ref 1.4–7.0)
Neutrophils: 65 %
Platelets: 243 10*3/uL (ref 150–450)
RBC: 4.26 x10E6/uL (ref 4.14–5.80)
RDW: 12.2 % (ref 11.6–15.4)
WBC: 5.9 10*3/uL (ref 3.4–10.8)

## 2023-01-12 LAB — LIPID PANEL
Chol/HDL Ratio: 2.5 {ratio} (ref 0.0–5.0)
Cholesterol, Total: 175 mg/dL (ref 100–199)
HDL: 70 mg/dL (ref >39)
LDL Chol Calc (NIH): 96 mg/dL (ref 0–99)
Triglycerides: 43 mg/dL (ref 0–149)
VLDL Cholesterol Cal: 9 mg/dL (ref 5–40)

## 2023-01-12 LAB — TESTOSTERONE: Testosterone: 20 ng/dL — ABNORMAL LOW (ref 264–916)

## 2023-01-12 LAB — ESTROGENS, TOTAL: Estrogen: 573 pg/mL — ABNORMAL HIGH (ref 56–213)

## 2023-01-13 NOTE — Addendum Note (Signed)
Addended by: Ronnald Nian on: 01/13/2023 09:39 AM   Modules accepted: Orders

## 2023-01-15 ENCOUNTER — Encounter: Payer: Self-pay | Admitting: Family Medicine

## 2023-01-17 MED ORDER — ESTRADIOL VALERATE 40 MG/ML IM OIL: 4.0000 mg | TOPICAL_OIL | INTRAMUSCULAR | 1 refills | Status: DC

## 2023-03-05 ENCOUNTER — Other Ambulatory Visit: Payer: Self-pay | Admitting: Family Medicine

## 2023-03-05 NOTE — Telephone Encounter (Signed)
Is this okay to refill?

## 2023-03-13 ENCOUNTER — Telehealth: Payer: Self-pay

## 2023-03-13 ENCOUNTER — Other Ambulatory Visit: Payer: 59

## 2023-03-13 ENCOUNTER — Encounter: Payer: Self-pay | Admitting: Family Medicine

## 2023-03-13 ENCOUNTER — Telehealth (INDEPENDENT_AMBULATORY_CARE_PROVIDER_SITE_OTHER): Payer: No Typology Code available for payment source | Admitting: Family Medicine

## 2023-03-13 VITALS — Ht 74.0 in | Wt 186.0 lb

## 2023-03-13 DIAGNOSIS — Z79899 Other long term (current) drug therapy: Secondary | ICD-10-CM

## 2023-03-13 DIAGNOSIS — F640 Transsexualism: Secondary | ICD-10-CM

## 2023-03-13 DIAGNOSIS — Z789 Other specified health status: Secondary | ICD-10-CM

## 2023-03-13 NOTE — Telephone Encounter (Signed)
error 

## 2023-03-13 NOTE — Progress Notes (Signed)
   Subjective:    Patient ID: Andre Stafford, adult    DOB: Aug 28, 1965, 58 y.o.   MRN: [id number]  HPI Documentation for virtual audio and video telecommunications through Caregility encounter: The patient was located at home. 2 patient identifiers used.  The provider was located in the office. The patient did consent to this visit and is aware of possible charges through their insurance for this visit. The other persons participating in this telemedicine service were none. Time spent on call was 5 minutes and in review of previous records >20 minutes total for counseling and coordination of care. This virtual service is not related to other E/M service within previous 7 days.  She has been on hormone replacement for approximately 1 year.  She is not interested in pursuing further surgical intervention including top surgery, and also some facial surgery.  Also mentioned facial feminization procedures.  Review of Systems     Objective:    Physical Exam Alert and in no distress otherwise not examined       Assessment & Plan:  Encounter for long-term (current) use of high-risk medication  Transgender I explained that I will look locally to see if there is a Engineer, petroleum that could help with all of these issues.  We will also pursue other options in [place] or even in [place].  She will check with some of her friends to see if they have thoughts and ideas on who they have heard does this kind of work.  Also discussed at some point considering orchiectomy and explained that eliminating testosterone would make it easier to control the estrogen levels.

## 2023-03-18 ENCOUNTER — Other Ambulatory Visit: Payer: No Typology Code available for payment source

## 2023-03-18 DIAGNOSIS — Z789 Other specified health status: Secondary | ICD-10-CM

## 2023-03-19 ENCOUNTER — Encounter: Payer: Self-pay | Admitting: Internal Medicine

## 2023-03-19 LAB — ESTROGENS, TOTAL: Estrogen: 794 pg/mL — ABNORMAL HIGH (ref 56–213)

## 2023-03-19 LAB — TESTOSTERONE: Testosterone: 7 ng/dL — ABNORMAL LOW (ref 264–916)

## 2023-03-20 ENCOUNTER — Encounter: Payer: Self-pay | Admitting: Family Medicine

## 2023-03-20 NOTE — Addendum Note (Signed)
 Addended by: Ronnald Nian on: 03/20/2023 10:24 AM   Modules accepted: Orders

## 2023-04-18 ENCOUNTER — Other Ambulatory Visit: Payer: No Typology Code available for payment source

## 2023-04-18 DIAGNOSIS — Z79899 Other long term (current) drug therapy: Secondary | ICD-10-CM

## 2023-04-18 DIAGNOSIS — Z789 Other specified health status: Secondary | ICD-10-CM

## 2023-04-20 LAB — ESTROGENS, TOTAL: Estrogen: 561 pg/mL — ABNORMAL HIGH (ref 56–213)

## 2023-04-22 ENCOUNTER — Encounter: Payer: Self-pay | Admitting: Family Medicine

## 2023-07-09 ENCOUNTER — Telehealth: Payer: Self-pay

## 2023-07-09 NOTE — Telephone Encounter (Signed)
 Copied from CRM [id number]. Topic: Clinical - Lab/Test Results >> Jul 09, 2023  9:50 AM Lotus Round B wrote: Reason for CRM: pt called in to see if she can get her estragon levels redone at the lab if possible. She would like once the order is put in to give her a call

## 2023-07-17 ENCOUNTER — Ambulatory Visit: Admitting: Family Medicine

## 2023-07-17 VITALS — BP 112/70 | HR 84 | Wt 192.4 lb

## 2023-07-17 DIAGNOSIS — Z79899 Other long term (current) drug therapy: Secondary | ICD-10-CM

## 2023-07-17 DIAGNOSIS — Z789 Other specified health status: Secondary | ICD-10-CM | POA: Diagnosis not present

## 2023-07-17 NOTE — Progress Notes (Signed)
   Subjective:    Patient ID: Andre Stafford, adult    DOB: 1965-11-21, 58 y.o.   MRN: [id number]  HPI The patient is here for follow-up visit.  She is presently taking 0.05 weekly of estrogen.  She normally gives her shots on Thursday and today is Wednesday.  The numbers from previous injections were reviewed.   Review of Systems     Objective:    Physical Exam  Alert and in no distress otherwise not examined      Assessment & Plan:  Transgender - Plan: Estrogens , Total, Testosterone   Encounter for long-term (current) use of high-risk medication - Plan: Estrogens , Total, Testosterone  I explained that this with the more of a trough level so hopefully this will be at a much lower level.  She did show me the Sarahn's that she is using and this amount of medicine which is really quite low.

## 2023-07-18 ENCOUNTER — Ambulatory Visit: Payer: Self-pay | Admitting: Family Medicine

## 2023-07-18 LAB — TESTOSTERONE: Testosterone: 66 ng/dL — ABNORMAL LOW (ref 264–916)

## 2023-07-18 LAB — ESTROGENS, TOTAL: Estrogen: 234 pg/mL — ABNORMAL HIGH (ref 56–213)

## 2023-08-19 ENCOUNTER — Telehealth: Payer: Self-pay | Admitting: Family Medicine

## 2023-08-19 NOTE — Telephone Encounter (Signed)
 Left message for pt regarding appt tomorrow with Dr. Vita, I need to speak with pt to see if she would like to see Dr. Joyce 08/28/23 8:45 instead

## 2023-08-20 ENCOUNTER — Encounter: Admitting: Family Medicine

## 2023-08-28 ENCOUNTER — Ambulatory Visit: Admitting: Family Medicine

## 2023-08-28 VITALS — BP 118/72 | HR 70 | Ht 74.0 in | Wt 189.0 lb

## 2023-08-28 DIAGNOSIS — N500 Atrophy of testis: Secondary | ICD-10-CM | POA: Diagnosis not present

## 2023-08-28 DIAGNOSIS — Z789 Other specified health status: Secondary | ICD-10-CM

## 2023-08-28 DIAGNOSIS — Z0000 Encounter for general adult medical examination without abnormal findings: Secondary | ICD-10-CM

## 2023-08-28 DIAGNOSIS — Z79899 Other long term (current) drug therapy: Secondary | ICD-10-CM | POA: Diagnosis not present

## 2023-08-28 DIAGNOSIS — F640 Transsexualism: Secondary | ICD-10-CM

## 2023-08-28 DIAGNOSIS — Z1211 Encounter for screening for malignant neoplasm of colon: Secondary | ICD-10-CM | POA: Diagnosis not present

## 2023-08-28 DIAGNOSIS — Z23 Encounter for immunization: Secondary | ICD-10-CM | POA: Diagnosis not present

## 2023-08-28 DIAGNOSIS — Z8042 Family history of malignant neoplasm of prostate: Secondary | ICD-10-CM | POA: Diagnosis not present

## 2023-08-28 NOTE — Progress Notes (Signed)
 Complete physical exam  Patient: Andre Stafford   DOB: Aug 13, 1965   58 y.o. Adult  MRN: [id number]  Subjective:    Chief Complaint  Patient presents with   Annual Exam    Fasting-pneumonia?    Andre Stafford is a 58 y.o. adult who presents today for a complete physical exam.  She reports consuming a general diet. Home exercise routine includes treadmill. She generally feels well. She reports sleeping well.  She continues on spironolactone  as well as estradiol  and is doing well on that.  Her last numbers look pretty good.  She does not smoke or drink.  She does consider herself on a journey and eventually wants to have surgery.  Most recent fall risk assessment:    08/28/2023    8:47 AM  Fall Risk   Falls in the past year? 0  Number falls in past yr: 0  Injury with Fall? 0  Risk for fall due to : No Fall Risks  Follow up Falls evaluation completed     Most recent depression screenings:    08/28/2023    8:47 AM 01/10/2023   10:31 AM  PHQ 2/9 Scores  PHQ - 2 Score 0 0        Immunization History  Administered Date(s) Administered   Influenza, Seasonal, Injecte, Preservative Fre 01/10/2023   Influenza,inj,Quad PF,6+ Mos 11/22/2020   Moderna Covid-19 Vaccine Bivalent Booster 13yrs & up 11/22/2020   Moderna SARS-COV2 Booster Vaccination 07/19/2020   Moderna Sars-Covid-2 Vaccination 03/27/2019, 04/23/2019, 12/31/2019   PNEUMOCOCCAL CONJUGATE-20 08/28/2023   Pfizer(Comirnaty)Fall Seasonal Vaccine 12 years and older 05/30/2022, 01/10/2023   Tdap 05/09/2020   Zoster Recombinant(Shingrix) 12/06/2020, 02/07/2021    Health Maintenance  Topic Date Due   HIV Screening  Never done   Hepatitis B Vaccines (1 of 3 - 19+ 3-dose series) Never done   Fecal DNA (Cologuard)  05/17/2023   COVID-19 Vaccine (7 - Moderna risk 2024-25 season) 09/13/2023 (Originally 07/11/2023)   INFLUENZA VACCINE  04/21/2024 (Originally 08/23/2023)   DTaP/Tdap/Td (2 - Td or Tdap) 05/10/2030    Pneumococcal Vaccine: 50+ Years  Completed   Hepatitis C Screening  Completed   Zoster Vaccines- Shingrix  Completed   Pneumococcal Vaccine: 19-49 Years  Aged Out   HPV VACCINES  Aged Out   Meningococcal B Vaccine  Aged Out    Patient Care Team: Joyce Norleen BROCKS, MD as PCP - General (Family Medicine)   Outpatient Medications Prior to Visit  Medication Sig   APPLE CIDER VINEGAR PO Take by mouth.   Ascorbic Acid (VITAMIN C PO) Take by mouth.   ASHWAGANDHA PO Take by mouth.   estradiol  valerate (DELESTROGEN ) 40 MG/ML injection Inject 0.1 mLs (4 mg total) into the muscle once a week.   NEEDLE, DISP, 18 G (BD DISP NEEDLES) 18G X 1-1/2 MISC Use for injection medication   NEEDLE, DISP, 23 G (BD DISP NEEDLE) 23G X 1 MISC Use for injection   Needles & Syringes (EASY TOUCH SYRINGE BARREL ) MISC Use to draw up medication   spironolactone  (ALDACTONE ) 50 MG tablet TAKE 1 TABLET (50 MG TOTAL) BY MOUTH 2 (TWO) TIMES DAILY. CURRENTLY TAKING ONE TABLET DAILY   No facility-administered medications prior to visit.    ROS  Family and social history as well as health maintenance and immunizations was reviewed.     Objective:    BP 118/72   Pulse 70   Ht 6' 2 (1.88 m)   Wt 189 lb (85.7  kg)   SpO2 99%   BMI 24.27 kg/m    Physical Exam       Assessment & Plan:     Routine general medical examination at a health care facility  Screening for colon cancer - Plan: Cologuard  Need for pneumococcal 20-valent conjugate vaccination - Plan: Pneumococcal conjugate vaccine 20-valent (Prevnar 20)  Encounter for long-term (current) use of high-risk medication  Family history of prostate cancer in father - Plan: PSA  Transgender  Also recommend multivitamin. Return in about 6 months (around 02/28/2024).      Norleen Jobs, MD

## 2023-08-28 NOTE — Progress Notes (Signed)
   Subjective:    Patient ID: Andre Stafford, adult    DOB: [birth year], 58 y.o.   MRN: [id number]  Discussed the use of AI scribe software for clinical note transcription with the patient, who gave verbal consent to proceed.  History of Present Illness   Andre Stafford is a 58 year old male who presents for a complete exam and follow-up regarding hormone therapy and surgical planning for gender affirmation.  She is currently undergoing counseling and is in the process of obtaining letters from therapists for surgical procedures, including breast augmentation and eventual vaginoplasty. She is researching different surgical techniques, particularly those involving abdominal lining for vaginoplasty.  She is on hormone therapy, taking estradiol  and spironolactone . She experiences some breast enlargement and notes a genetic predisposition to limited breast growth. She takes estradiol  shots every two weeks and notices a difference in how she feels towards the end of the cycle, with occasional headaches. Her testicles have shrunk, which she attributes to the hormone therapy.  She has a family history of prostate cancer. She has received a shingles vaccination.  Her social history includes occasional alcohol consumption, no smoking, and no recreational drug use. She maintains a healthy lifestyle and is not sexually active, thus not concerned about STDs. She also notes that if she does not drink enough water, she experiences back pain.           Review of Systems     Objective:    Physical Exam Physical Exam    Alert and in no distress. Tympanic membranes and canals are normal. Pharyngeal area is normal. Neck is supple without adenopathy or thyromegaly. Cardiac exam shows a regular sinus rhythm without murmurs or gallops. Lungs are clear to auscultation.  Abdominal exam shows no masses or tenderness.  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          Assessment & Plan:  Assessment and Plan    Gender dysphoria Undergoing hormone therapy with estradiol  and spironolactone . Considering gender-affirming surgeries. Experiencing hormonal fluctuations. - Continue estradiol  and spironolactone  therapy. - Monitor hormonal levels and symptoms. - Assist in finding a therapist for surgery letters. - Consider referral to a surgeon for gender-affirming surgery when letters are obtained.  Testicular atrophy Common effect of ongoing hormone therapy. - Continue monitoring as part of ongoing hormone therapy.  General Health Maintenance Due for colon cancer screening. Family history of prostate cancer. Immunizations up to date except for pneumonia vaccine. - Order Cologuard for colon cancer screening. - Order PSA test. - Administer pneumonia vaccine. - Discuss flu and COVID vaccines in the fall.

## 2023-09-13 ENCOUNTER — Ambulatory Visit: Payer: Self-pay | Admitting: Family Medicine

## 2023-09-13 LAB — PSA: Prostate Specific Ag, Serum: 0.2 ng/mL (ref 0.0–4.0)

## 2023-09-17 LAB — COLOGUARD: COLOGUARD: NEGATIVE

## 2023-11-26 ENCOUNTER — Other Ambulatory Visit: Payer: Self-pay | Admitting: Family Medicine

## 2023-11-26 MED ORDER — ESTRADIOL VALERATE 40 MG/ML IM OIL: 4.0000 mg | TOPICAL_OIL | INTRAMUSCULAR | 1 refills | Status: DC

## 2023-11-26 MED ORDER — SPIRONOLACTONE 50 MG PO TABS: 50.0000 mg | ORAL_TABLET | Freq: Two times a day (BID) | ORAL | 2 refills | Status: AC

## 2023-11-26 NOTE — Telephone Encounter (Signed)
 Copied from CRM [id number]. Topic: Clinical - Medication Refill >> Nov 26, 2023 12:24 PM Deaijah H wrote: Medication: spironolactone  (ALDACTONE ) 50 MG tablet/estradiol  valerate (DELESTROGEN ) 40 MG/ML injection  Has the patient contacted their pharmacy? Yes (Agent: If no, request that the patient contact the pharmacy for the refill. If patient does not wish to contact the pharmacy document the reason why and proceed with request.) Needed to contact Dr. (Agent: If yes, when and what did the pharmacy advise?)  This is the patient's preferred pharmacy:  [id number] [place], [place] - [address] [place] [address] Phone: [phone] Fax: [phone]  Is this the correct pharmacy for this prescription? Yes If no, delete pharmacy and type the correct one.   Has the prescription been filled recently? No  Is the patient out of the medication? No, one shot left   Has the patient been seen for an appointment in the last year OR does the patient have an upcoming appointment? Yes  Can we respond through MyChart? Yes  Agent: Please be advised that Rx refills may take up to 3 business days. We ask that you follow-up with your pharmacy.

## 2024-02-01 ENCOUNTER — Other Ambulatory Visit: Payer: Self-pay | Admitting: Family Medicine

## 2024-02-26 ENCOUNTER — Ambulatory Visit: Payer: Self-pay | Admitting: Family Medicine

## 2024-02-26 VITALS — BP 126/78 | HR 77 | Wt 205.0 lb

## 2024-02-26 DIAGNOSIS — Z0000 Encounter for general adult medical examination without abnormal findings: Secondary | ICD-10-CM

## 2024-02-26 DIAGNOSIS — Z789 Other specified health status: Secondary | ICD-10-CM | POA: Diagnosis not present

## 2024-02-26 DIAGNOSIS — Z23 Encounter for immunization: Secondary | ICD-10-CM | POA: Diagnosis not present

## 2024-02-26 DIAGNOSIS — Z79899 Other long term (current) drug therapy: Secondary | ICD-10-CM | POA: Diagnosis not present

## 2024-02-26 NOTE — Progress Notes (Signed)
" ° °  Subjective:    Patient ID: Andre Stafford, adult    DOB: Jul 26, 1965, 59 y.o.   MRN: [id number]  Discussed the use of AI scribe software for clinical note transcription with the patient, who gave verbal consent to proceed.  History of Present Illness   Andre Stafford is a 59 year old male who presents for medication monitoring.  She is currently on spironolactone  and estradiol . Estradiol  injections are administered every two weeks at a dose of 0.025 mL. Her last injection was a week and a half ago, and she is due for her next dose this coming Thursday. She feels different and not like herself a few days after missing her scheduled injection, which was delayed due to travel. During this time, she also experienced headaches.  She reports breast development as a result of taking spironolactone .  She is up to date on her shingles, pneumonia, and tetanus vaccinations. She has not yet had the COVID vaccine.           Review of Systems     Objective:    Physical Exam Alert and in no distress otherwise not examined           Assessment & Plan:  Gender-affirming hormone therapy monitoring Currently on spironolactone  and estradiol  injections. Effective therapy indicated by breast development. Monitoring for potential liver issues due to estrogen use. - Continue estradiol  injections at 0.025 mL every two weeks. - Ordered blood work to monitor liver function and hormone levels.  General Health Maintenance Up to date on shingles, pneumonia, and tetanus vaccinations. Agreed to receive flu and COVID vaccinations.        "

## 2024-02-28 ENCOUNTER — Ambulatory Visit: Payer: Self-pay | Admitting: Family Medicine

## 2024-02-28 LAB — COMPREHENSIVE METABOLIC PANEL WITH GFR
ALT: 14 [IU]/L (ref 0–44)
AST: 17 [IU]/L (ref 0–40)
Albumin: 4.3 g/dL (ref 3.8–4.9)
Alkaline Phosphatase: 68 [IU]/L (ref 47–123)
BUN/Creatinine Ratio: 12 (ref 9–20)
BUN: 13 mg/dL (ref 6–24)
Bilirubin Total: 0.4 mg/dL (ref 0.0–1.2)
CO2: 21 mmol/L (ref 20–29)
Calcium: 9.7 mg/dL (ref 8.7–10.2)
Chloride: 104 mmol/L (ref 96–106)
Creatinine, Ser: 1.12 mg/dL (ref 0.76–1.27)
Globulin, Total: 2.2 g/dL (ref 1.5–4.5)
Glucose: 100 mg/dL — ABNORMAL HIGH (ref 70–99)
Potassium: 4.7 mmol/L (ref 3.5–5.2)
Sodium: 140 mmol/L (ref 134–144)
Total Protein: 6.5 g/dL (ref 6.0–8.5)
eGFR: 76 mL/min/{1.73_m2}

## 2024-02-28 LAB — CBC WITH DIFFERENTIAL/PLATELET
Basophils Absolute: 0.1 10*3/uL (ref 0.0–0.2)
Basos: 1 %
EOS (ABSOLUTE): 0.2 10*3/uL (ref 0.0–0.4)
Eos: 5 %
Hematocrit: 40.4 % (ref 37.5–51.0)
Hemoglobin: 13.5 g/dL (ref 13.0–17.7)
Immature Grans (Abs): 0 10*3/uL (ref 0.0–0.1)
Immature Granulocytes: 0 %
Lymphocytes Absolute: 1.1 10*3/uL (ref 0.7–3.1)
Lymphs: 25 %
MCH: 30 pg (ref 26.6–33.0)
MCHC: 33.4 g/dL (ref 31.5–35.7)
MCV: 90 fL (ref 79–97)
Monocytes Absolute: 0.6 10*3/uL (ref 0.1–0.9)
Monocytes: 14 %
Neutrophils Absolute: 2.4 10*3/uL (ref 1.4–7.0)
Neutrophils: 55 %
Platelets: 209 10*3/uL (ref 150–450)
RBC: 4.5 x10E6/uL (ref 4.14–5.80)
RDW: 11.9 % (ref 11.6–15.4)
WBC: 4.4 10*3/uL (ref 3.4–10.8)

## 2024-02-28 LAB — LIPID PANEL
Chol/HDL Ratio: 3.6 ratio (ref 0.0–5.0)
Cholesterol, Total: 226 mg/dL — ABNORMAL HIGH (ref 100–199)
HDL: 63 mg/dL
LDL Chol Calc (NIH): 155 mg/dL — ABNORMAL HIGH (ref 0–99)
Triglycerides: 50 mg/dL (ref 0–149)
VLDL Cholesterol Cal: 8 mg/dL (ref 5–40)

## 2024-02-28 LAB — ESTROGENS, TOTAL: Estrogen: 152 pg/mL (ref 56–213)

## 2024-02-28 LAB — TESTOSTERONE: Testosterone: 137 ng/dL — AB (ref 264–916)

## 2024-09-01 ENCOUNTER — Encounter: Admitting: Family Medicine
